# Patient Record
Sex: Male | Born: 1975 | Race: White | Hispanic: No | Marital: Single | Smoking: Former smoker
Health system: Southern US, Community
[De-identification: ages and names within clinical notes are randomized; demographics above are authoritative.]

## PROBLEM LIST (undated history)

## (undated) DIAGNOSIS — C801 Malignant (primary) neoplasm, unspecified: Secondary | ICD-10-CM

## (undated) HISTORY — PX: TESTICLE REMOVAL: SHX68

---

## 2003-11-25 ENCOUNTER — Ambulatory Visit (HOSPITAL_COMMUNITY): Admission: RE | Admit: 2003-11-25 | Discharge: 2003-11-25 | Payer: Self-pay | Admitting: Urology

## 2003-11-28 ENCOUNTER — Observation Stay (HOSPITAL_COMMUNITY): Admission: RE | Admit: 2003-11-28 | Discharge: 2003-11-29 | Payer: Self-pay | Admitting: Urology

## 2003-12-09 ENCOUNTER — Ambulatory Visit: Admission: RE | Admit: 2003-12-09 | Discharge: 2004-02-06 | Payer: Self-pay | Admitting: Radiation Oncology

## 2014-08-23 ENCOUNTER — Other Ambulatory Visit: Payer: Self-pay | Admitting: Family

## 2014-08-23 ENCOUNTER — Ambulatory Visit
Admission: RE | Admit: 2014-08-23 | Discharge: 2014-08-23 | Disposition: A | Discharge status: Home / Self Care | Payer: Medicaid Other | Source: Ambulatory Visit | Attending: Family | Admitting: Family

## 2014-08-23 DIAGNOSIS — M545 Low back pain, unspecified: Secondary | ICD-10-CM

## 2016-01-01 IMAGING — CR DG THORACIC SPINE 3V
3 series · 3 of 3 positions shown · non-contrast
Comparison: None.

CLINICAL DATA: Back pain

EXAM:
THORACIC SPINE - 2 VIEW + SWIMMERS

[view not recorded (1 of 3)]
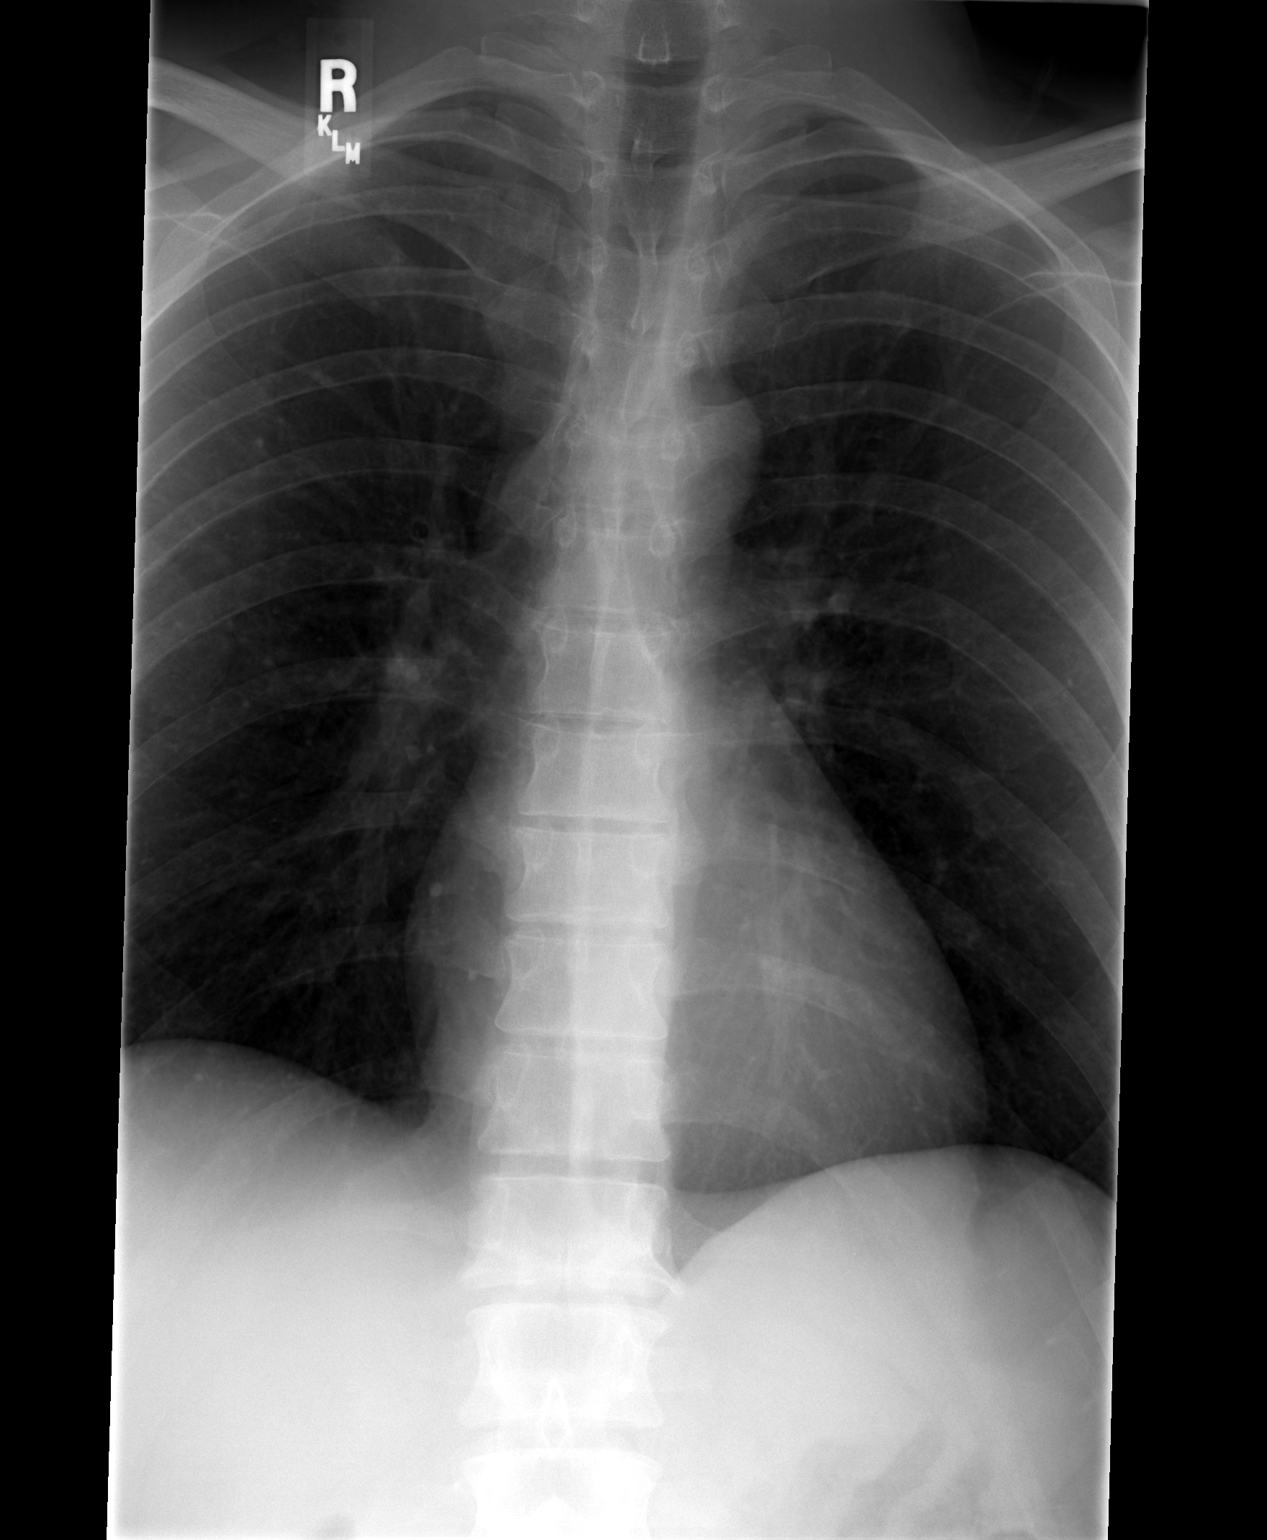

[view not recorded (2 of 3)]
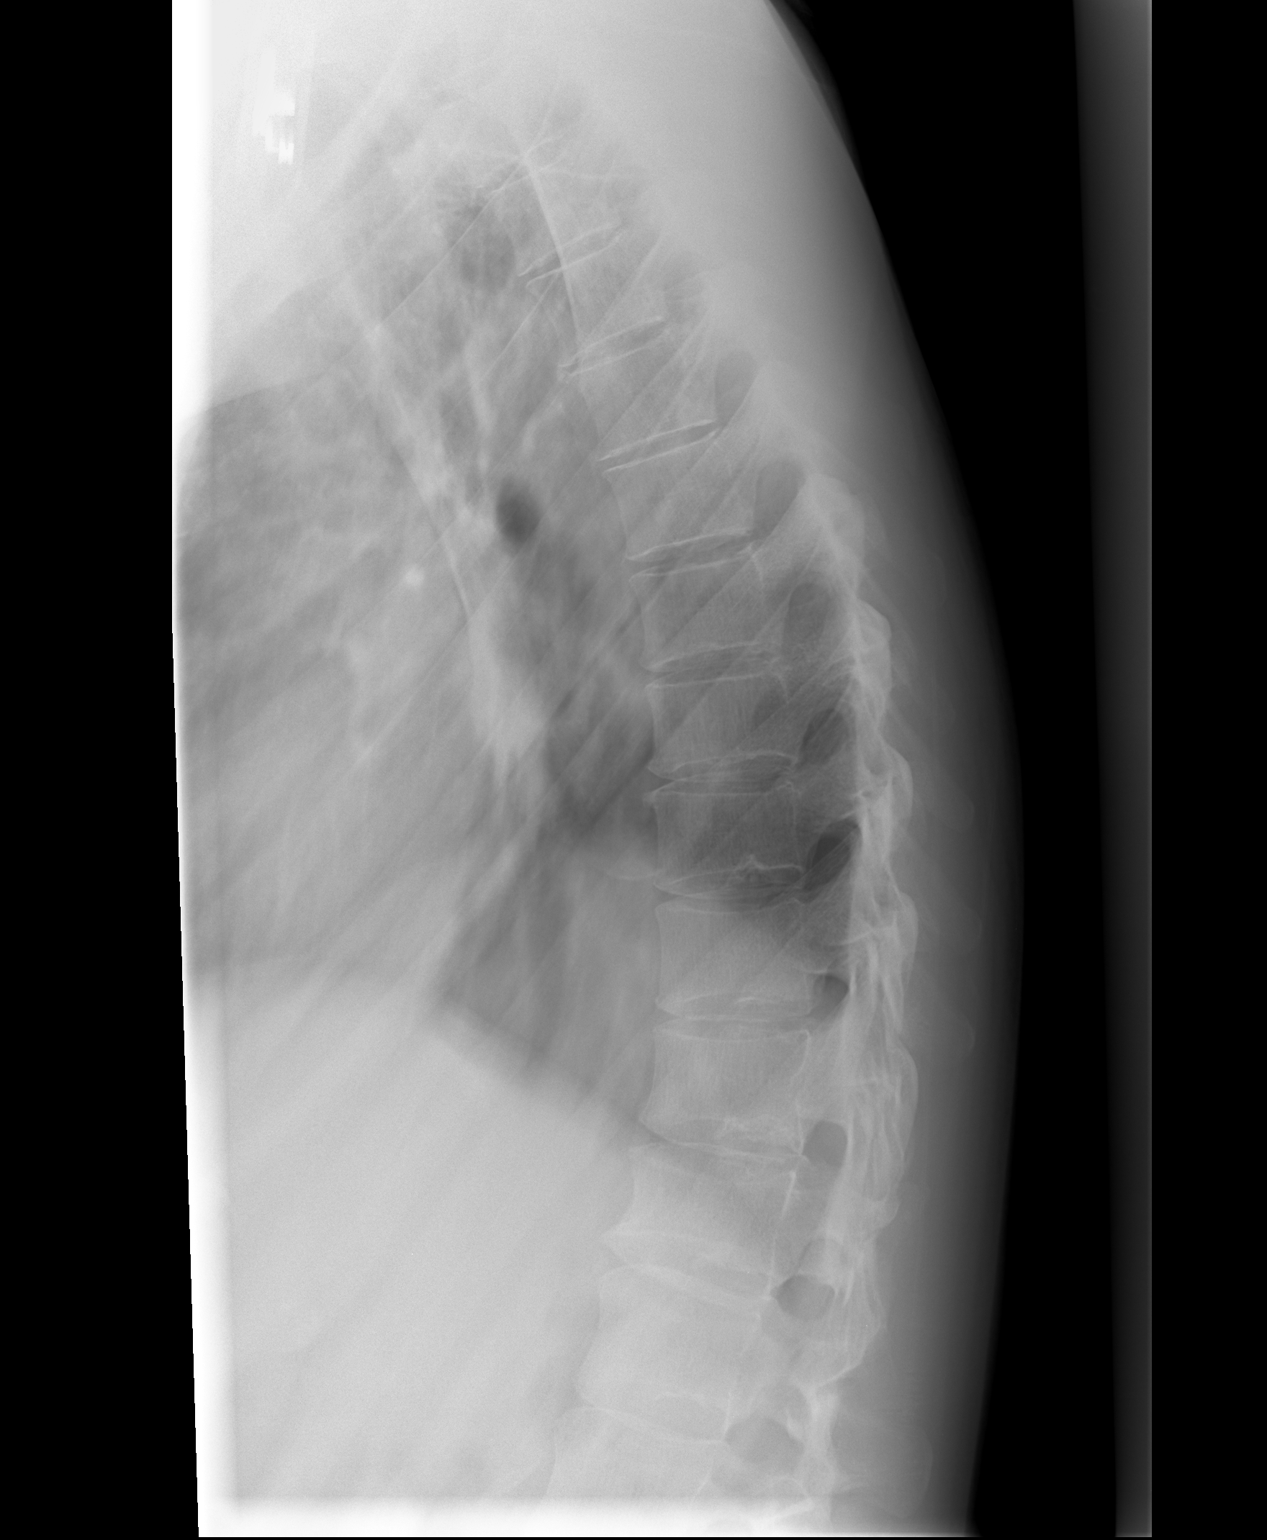

[view not recorded (3 of 3)]
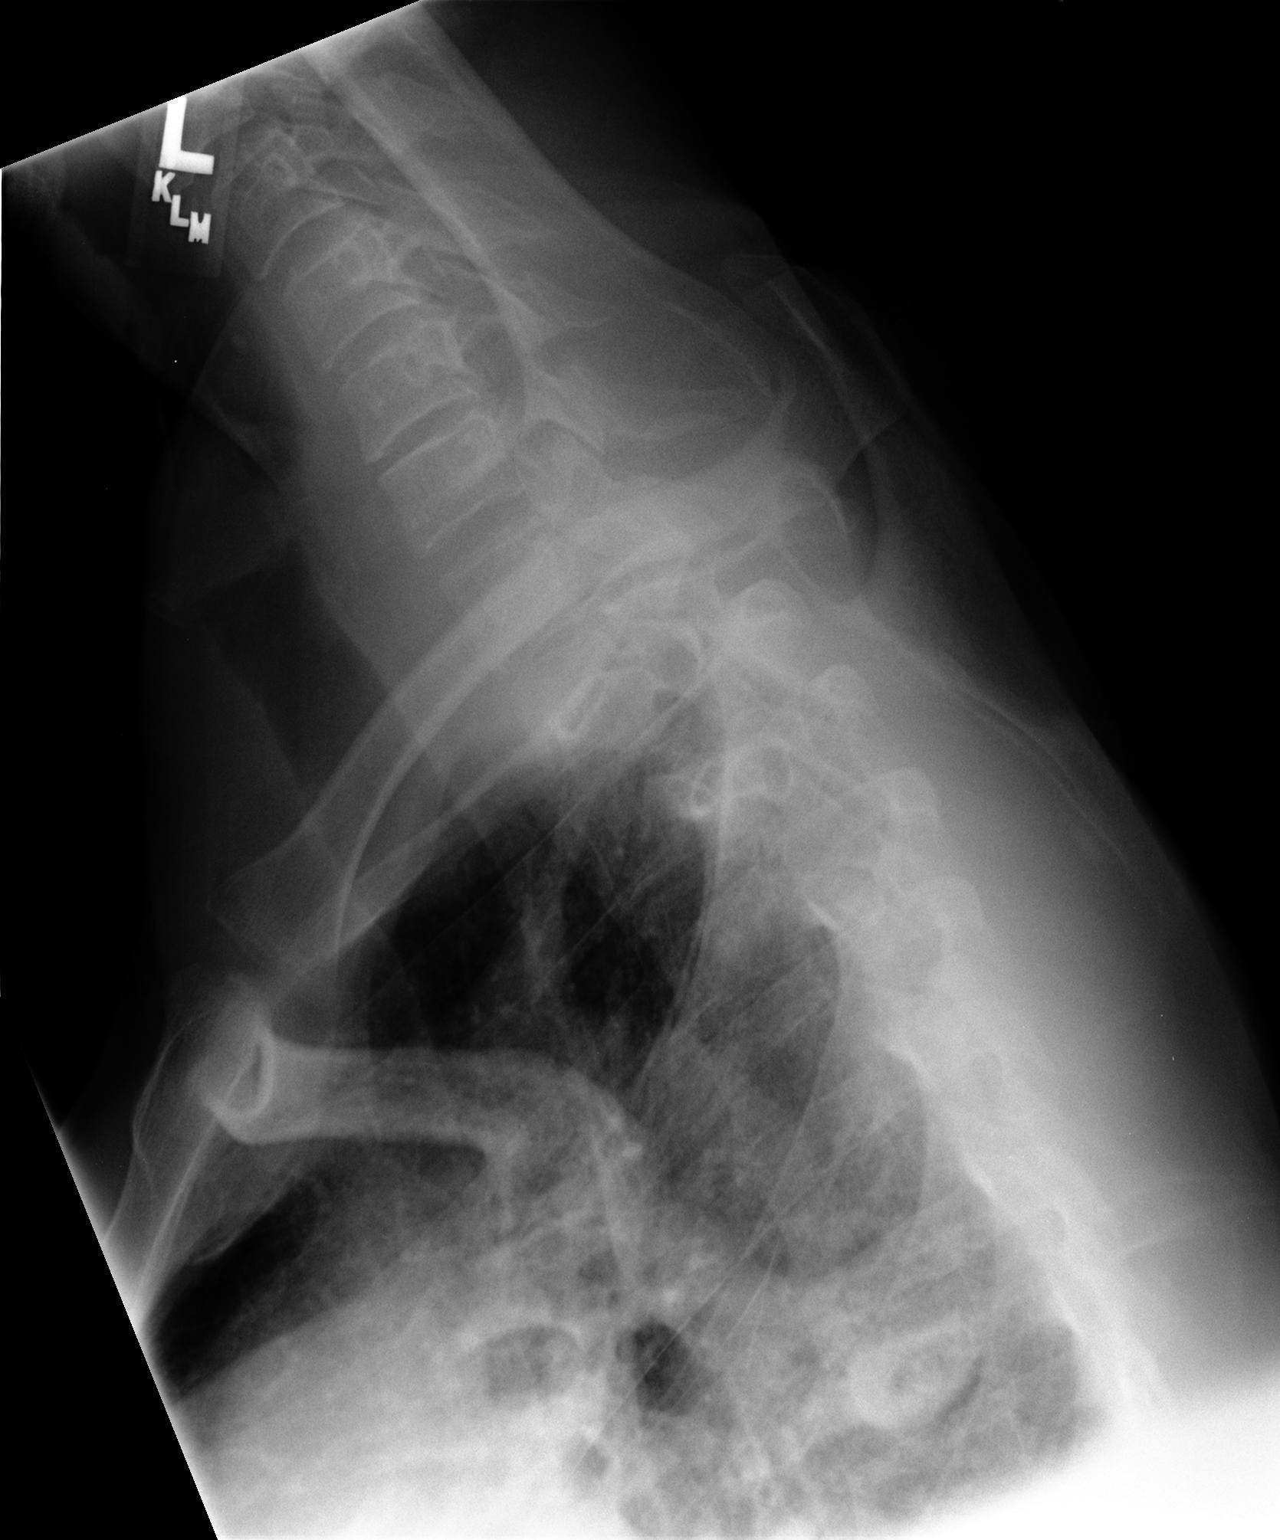

[3 of 3 positions shown; findings below may reference images not displayed]

FINDINGS: Three views of thoracic spine submitted. No acute fracture or
subluxation. Minimal degenerative changes with anterior spurring
lower thoracic spine.
IMPRESSION: No acute fracture or subluxation. Minimal anterior spurring lower
thoracic spine.

## 2017-05-02 ENCOUNTER — Ambulatory Visit (INDEPENDENT_AMBULATORY_CARE_PROVIDER_SITE_OTHER): Payer: Self-pay

## 2017-05-02 ENCOUNTER — Other Ambulatory Visit: Payer: Self-pay | Admitting: Emergency Medicine

## 2017-05-02 DIAGNOSIS — M25511 Pain in right shoulder: Secondary | ICD-10-CM

## 2017-05-02 DIAGNOSIS — R52 Pain, unspecified: Secondary | ICD-10-CM

## 2018-09-10 IMAGING — DX DG SHOULDER 2+V*R*
3 series · 3 of 3 positions shown · non-contrast
Comparison: None.

CLINICAL DATA: Pain after heavy lifting

EXAM:
RIGHT SHOULDER - 2+ VIEW

[shoulder grashey]
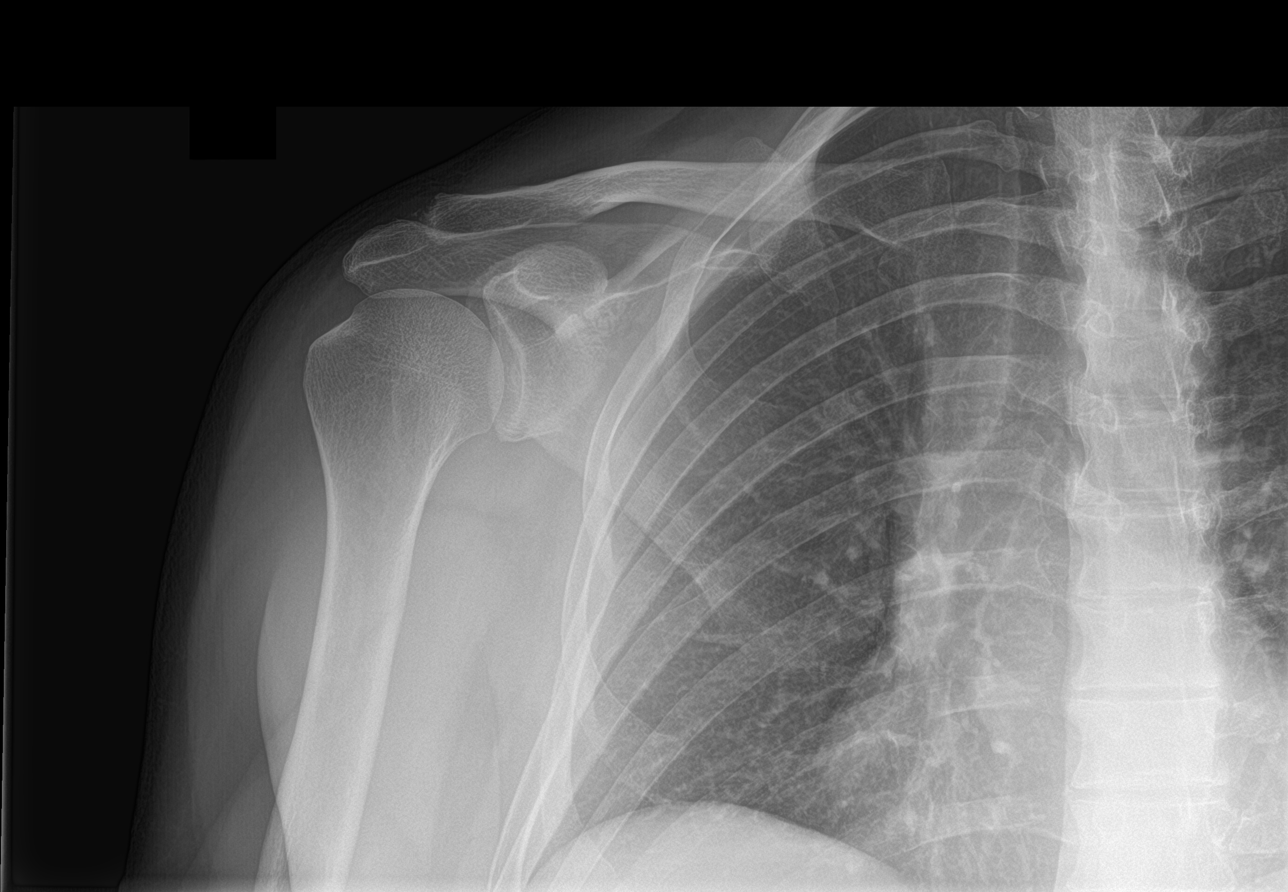

[shoulder y view]
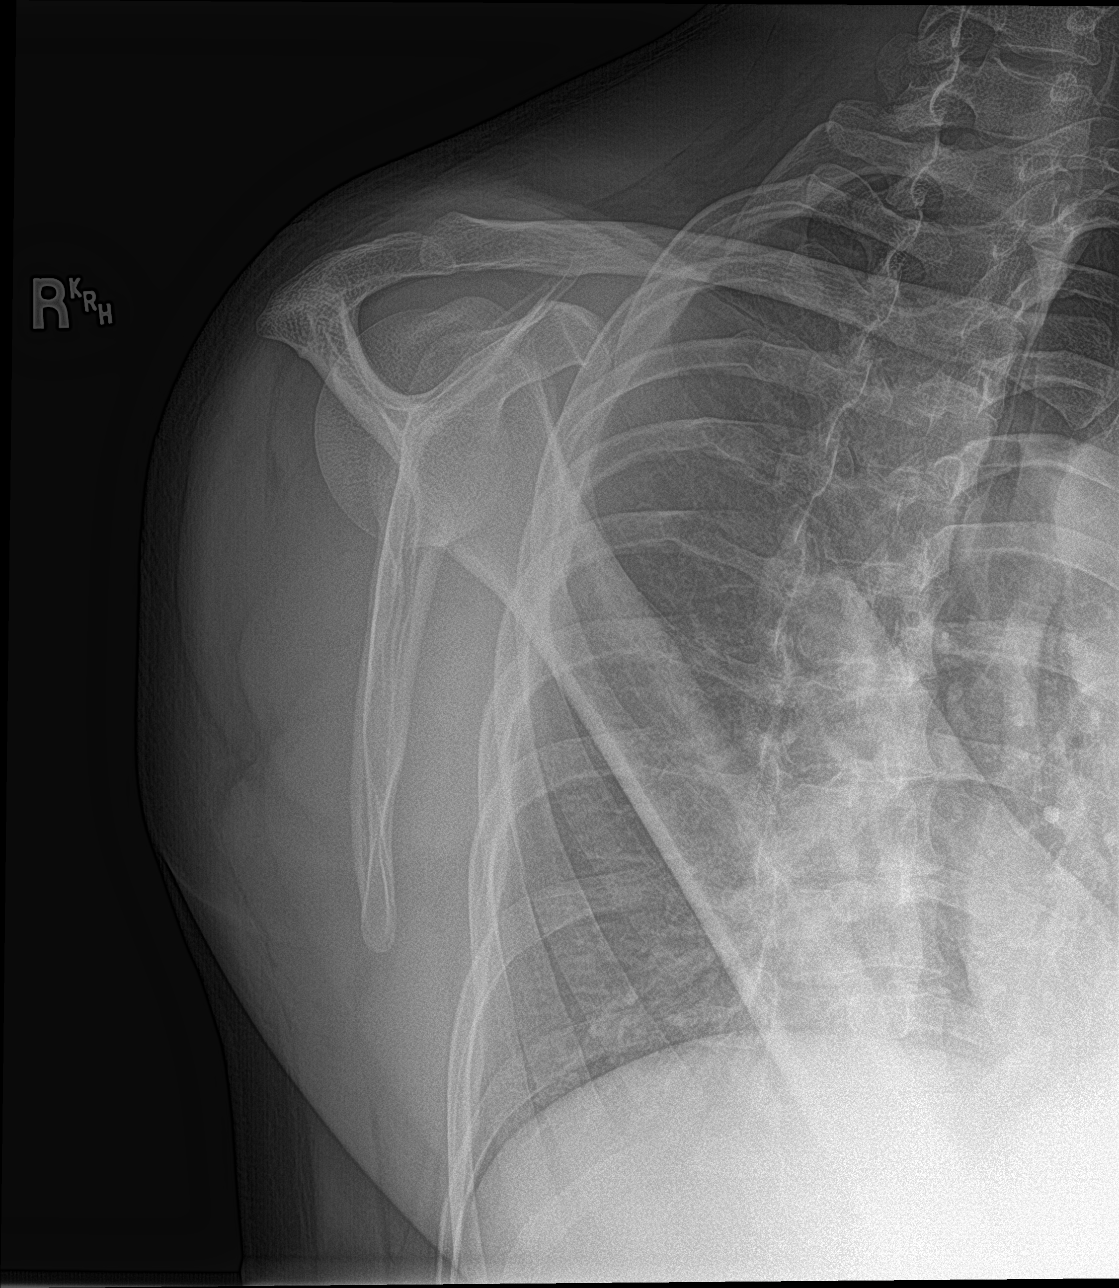

[shoulder axillary]
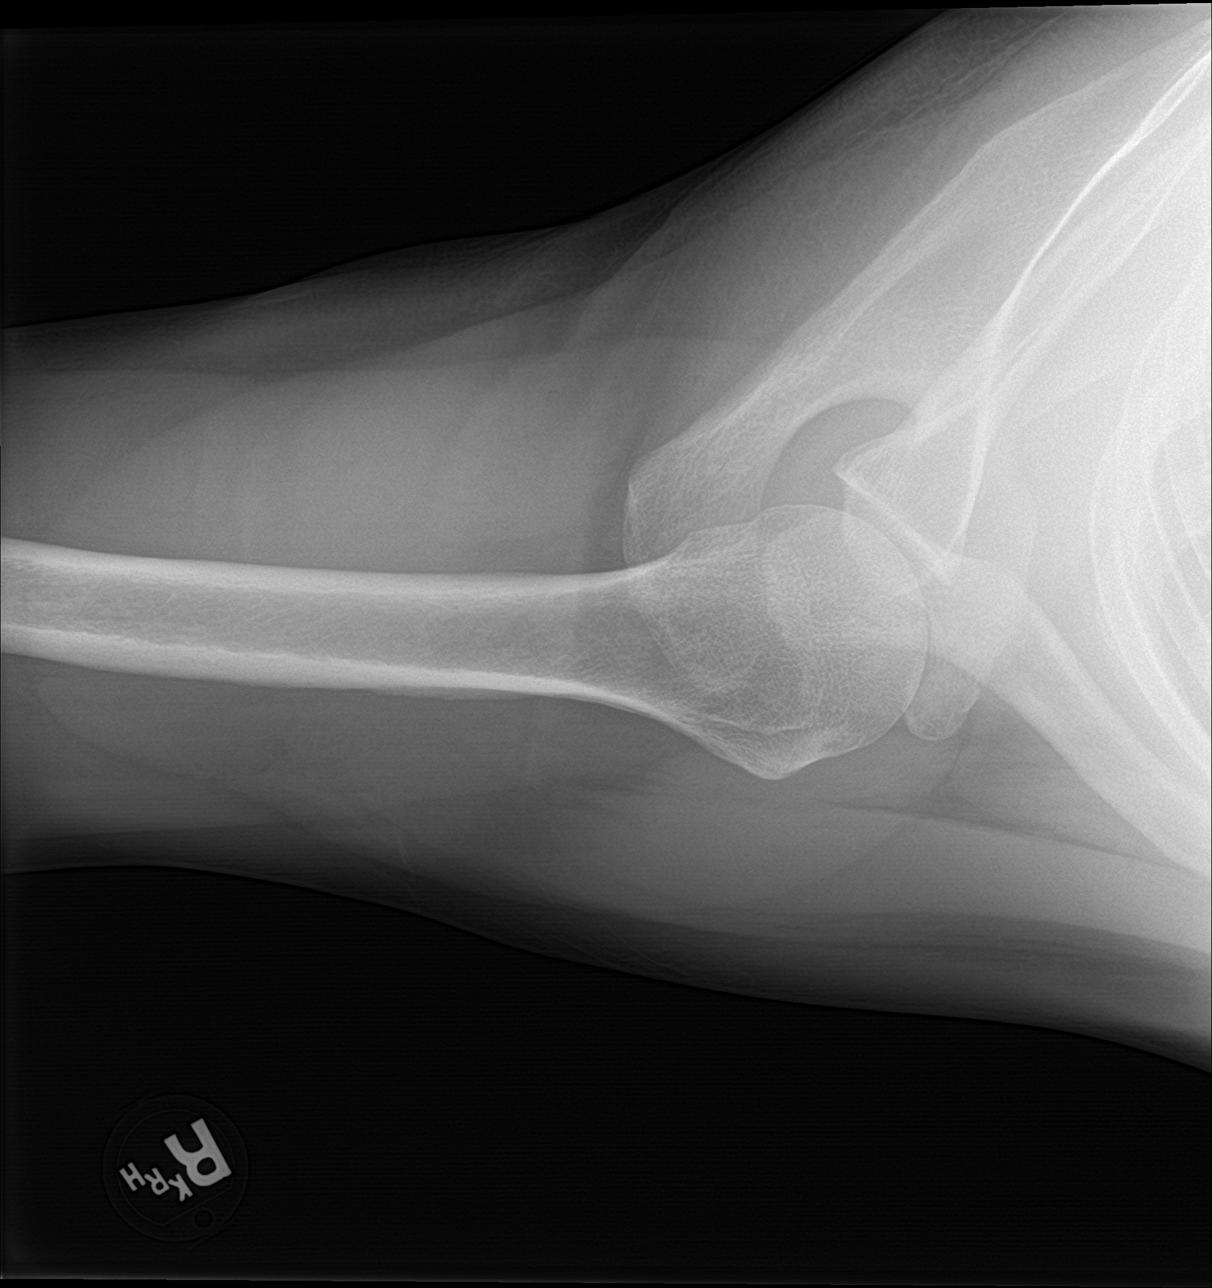

[3 of 3 positions shown; findings below may reference images not displayed]

FINDINGS: Frontal, Y scapular, and axillary images were obtained. No fracture
or dislocation. No appreciable joint space narrowing. Subtle erosion
noted in the lateral right clavicle. Visualized right lung clear
peer
IMPRESSION: Erosion noted in lateral right clavicle. No joint space narrowing.
No fracture or dislocation.

## 2024-05-17 ENCOUNTER — Ambulatory Visit (INDEPENDENT_AMBULATORY_CARE_PROVIDER_SITE_OTHER): Payer: Self-pay

## 2024-05-17 ENCOUNTER — Other Ambulatory Visit: Payer: Self-pay

## 2024-05-17 ENCOUNTER — Ambulatory Visit
Admission: RE | Admit: 2024-05-17 | Discharge: 2024-05-17 | Disposition: A | Discharge status: Home / Self Care | Payer: Self-pay | Source: Ambulatory Visit | Attending: Emergency Medicine | Admitting: Emergency Medicine

## 2024-05-17 VITALS — BP 129/94 | HR 76 | Temp 97.9°F | Resp 16

## 2024-05-17 DIAGNOSIS — S6992XA Unspecified injury of left wrist, hand and finger(s), initial encounter: Secondary | ICD-10-CM

## 2024-05-17 HISTORY — DX: Malignant (primary) neoplasm, unspecified: C80.1

## 2024-05-17 NOTE — Discharge Instructions (Signed)
 Rest - try to avoid heavy lifting Ice - apply for 20 minutes a few times daily Compression - use buddy taping or coban wrap for support Elevation - prop up on a pillow  Ibuprofen/advil every 6 hours for pain  Please follow up with [place] [facility] if needed

## 2024-05-17 NOTE — ED Triage Notes (Signed)
 Pt states he fell walking up the stairs and fell with all his weight and used his hands to catch himself and the left middle finger bent sideways. Pt's left middle finger is deformed. Pt has 2+ swelling in knuckle area.

## 2024-05-17 NOTE — ED Provider Notes (Signed)
 [facility]    CSN: [id number] Arrival date & time: 05/17/24  1728      History   Chief Complaint Chief Complaint  Patient presents with   Finger Injury    Tripped on stairs, finger slightly crooked - Entered by patient    HPI Christopher Kirby is a 48 y.o. male.  Walking up the stairs yesterday when he tripped, caught himself with his LEFT hand.  He noticed the left hand middle finger looks slightly crooked.  He is having pain in the middle and ring fingers. Iced and took advil this morning   Patient is right hand dominant   Past Medical History:  Diagnosis Date   Cancer (HCC)     There are no active problems to display for this patient.   Past Surgical History:  Procedure Laterality Date   TESTICLE REMOVAL Left        Home Medications    Prior to Admission medications   Not on File    Family History History reviewed. No pertinent family history.  Social History Social History   Tobacco Use   Smoking status: Former    Types: Cigarettes   Smokeless tobacco: Never  Substance Use Topics   Alcohol use: Yes    Comment: occ   Drug use: Never     Allergies   Penicillins   Review of Systems Review of Systems Per HPI  Physical Exam Triage Vital Signs ED Triage Vitals  Encounter Vitals Group     BP 05/17/24 1806 (!) 129/94     Systolic BP Percentile --      Diastolic BP Percentile --      Pulse Rate 05/17/24 1806 76     Resp 05/17/24 1806 16     Temp 05/17/24 1806 97.9 F (36.6 C)     Temp Source 05/17/24 1806 Oral     SpO2 05/17/24 1806 94 %     Weight --      Height --      Head Circumference --      Peak Flow --      Pain Score 05/17/24 1804 6     Pain Loc --      Pain Education --      Exclude from Growth Chart --    No data found.  Updated Vital Signs BP (!) 129/94   Pulse 76   Temp 97.9 F (36.6 C) (Oral)   Resp 16   SpO2 94%   Physical Exam Vitals and nursing note reviewed.  Constitutional:       General: He is not in acute distress. HENT:     Mouth/Throat:     Pharynx: Oropharynx is clear.  Cardiovascular:     Rate and Rhythm: Normal rate and regular rhythm.     Pulses: Normal pulses.     Heart sounds: Normal heart sounds.  Pulmonary:     Effort: Pulmonary effort is normal.     Breath sounds: Normal breath sounds.  Musculoskeletal:     Left hand: Swelling and bony tenderness present. Decreased range of motion. Decreased strength. Normal sensation. Normal capillary refill. Normal pulse.       Hands:     Comments: Mild swelling at left hand middle finger MCP. Bony tenderness middle and ring MCP. Limited ROM due to pain, decreased strength due to limited ROM. Distal sensation intact. Cap refill < 2 seconds. There is no obvious deformity unless patient holds hand horizontally (bent 90 degrees at elbow),  the middle finger seems to dip towards ring finger  Skin:    General: Skin is warm and dry.     Capillary Refill: Capillary refill takes less than 2 seconds.  Neurological:     Mental Status: He is alert and oriented to person, place, and time.     UC Treatments / Results  Labs (all labs ordered are listed, but only abnormal results are displayed) Labs Reviewed - No data to display  EKG   Radiology DG Finger Middle Left Result Date: 05/17/2024 CLINICAL DATA:  Status post fall with deformity. EXAM: LEFT MIDDLE FINGER 2+V COMPARISON:  None Available. FINDINGS: There is no evidence of fracture or dislocation. The alignment and joint spaces are normal. There is no evidence of arthropathy or other focal bone abnormality. Soft tissues are unremarkable. IMPRESSION: No fracture or dislocation of the left middle finger. Electronically Signed   By: Chadwick Colonel M.D.   On: 05/17/2024 18:36    Procedures Procedures (including critical care time)  Medications Ordered in UC Medications - No data to display  Initial Impression / Assessment and Plan / UC Course  I have reviewed  the triage vital signs and the nursing notes.  Pertinent labs & imaging results that were available during my care of the patient were reviewed by me and considered in my medical decision making (see chart for details).  Xray of left hand without fracture or dislocation. Images independently reviewed by me, agree with radiology interpretation. Discussed with patient, likely sprain or tendon injury. Buddy wrapped in clinic for support. RICE therapy and pain control. Close follow with sports med if symptoms persist. Patient agrees to plan, no questions  Final Clinical Impressions(s) / UC Diagnoses   Final diagnoses:  Hand injury, left, initial encounter     Discharge Instructions      Rest - try to avoid heavy lifting Ice - apply for 20 minutes a few times daily Compression - use buddy taping or coban wrap for support Elevation - prop up on a pillow  Ibuprofen/advil every 6 hours for pain  Please follow up with [place] [facility] if needed   ED Prescriptions   None    PDMP not reviewed this encounter.   Marwin Primmer, Beth Brooke 05/17/24 4098

## 2024-05-29 ENCOUNTER — Ambulatory Visit (INDEPENDENT_AMBULATORY_CARE_PROVIDER_SITE_OTHER): Payer: Self-pay | Admitting: Family Medicine

## 2024-05-29 ENCOUNTER — Encounter: Payer: Self-pay | Admitting: Family Medicine

## 2024-05-29 ENCOUNTER — Other Ambulatory Visit: Payer: Self-pay

## 2024-05-29 VITALS — BP 116/84 | Ht 68.0 in | Wt 220.0 lb

## 2024-05-29 DIAGNOSIS — S63659A Sprain of metacarpophalangeal joint of unspecified finger, initial encounter: Secondary | ICD-10-CM

## 2024-05-29 DIAGNOSIS — M79645 Pain in left finger(s): Secondary | ICD-10-CM

## 2024-05-29 NOTE — Progress Notes (Signed)
 PCP: Pcp, No  Subjective:  CC: Lt 3rd finger injury HPI: Patient is a 48 y.o. male here for a misaligned finger. RHD  He states that 2 weeks ago he fell while at work and jammed his fingers on a stair case leading to excess ulnar deviation of his left middle finger. Immediately after the fall he had pain along the MCP joints of his 3rd and 4th MCPs as well as ulnar deviation of his 3rd finger. He also endorses swelling along the doral aspect of his hand. He was seen at urgent care that day and x-rays were obtained which were not significant for any bony abnormalities. He was sent home with a buddy taping and his pain has improved since that time, but he continued to have excess ulnar deviation of the 3rd finger. 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 He is able to passively move the digit back into alignment, however it will deviate when force is removed. He has not had any weakness, numbness, or tingling of the digit but does endorse dislocating the finger 8 years ago in a skiing accident. At that time he notes the same finger dislocated laterally at the MCP joint.   Past Medical History:  Diagnosis Date   Cancer (HCC)     No current outpatient medications on file prior to visit.   No current facility-administered medications on file prior to visit.    Past Surgical History:  Procedure Laterality Date   TESTICLE REMOVAL Left     Allergies  Allergen Reactions   Penicillins Dermatitis    BP 116/84   Ht 5\' 8"  (1.727 m)   Wt 220 lb (99.8 kg)   BMI 33.45 kg/m       No data to display              No data to display              Objective:  Physical Exam:  Gen: NAD, comfortable in exam room  Neuro: Sensation: intact to light touch  MSK: Left Hand  Inspection: mild swelling of the 3rd digit, ulnar deviation of 3rd digit over 4th Palpation: no  tenderness to DIP, PIP, MCP, carpal bones, and metacarpals  ROM: normal to flexion and extension at DIP, PIP, MCP Strength: 5/5 to flexion at DIP, PIP, MCP; 4/5 to radial deviation of 3rd finger Special Tests: varus stress of Lt 3rd MCP with increased laxity of radial collateral ligament at MCP without a solid endpoint.  Neg valgus stress at the 3rd MCP.  Neg varus/valgus stress at DIP/PIP of 3rd finger.     Assessment & Plan:  Patient is a 48 y.o. male here for misaligned 3rd finger. Given the mechanism of his injury, previous history of 3rd MCP joint dislocation, and finding of excess joint laxity on physical exam, concerned that his deviation is due to tearing of the radial collateral ligament of the 3rd MCP. Could also be secondary to a carpal bone fracture, however suspicion low given lack of tenderness to palpation and previous xray that was unremarkable. We will proceed with ultrasound of the left hand and refer to hand surgery for further evaluation.   1. Left Radial MCP Collateral Ligament Tear - US  to confirm diagnosis and evaluate for possible late-appearing fracture - Referral to hand surgery for further evaluation  - Buddy taping of 3rd digit  to 2nd digit to prevent deviation towards side of injury - Will follow-up as needed pending workup with hand surgery  Elnoria Hails, MS4 [facility]

## 2024-06-19 ENCOUNTER — Ambulatory Visit (INDEPENDENT_AMBULATORY_CARE_PROVIDER_SITE_OTHER): Payer: Self-pay | Admitting: Orthopedic Surgery

## 2024-06-19 DIAGNOSIS — S6992XA Unspecified injury of left wrist, hand and finger(s), initial encounter: Secondary | ICD-10-CM

## 2024-06-19 NOTE — Progress Notes (Signed)
 Christopher Kirby - 48 y.o. male MRN [id number]  Date of birth: 1976-04-26  Office Visit Note: Visit Date: 06/19/2024 PCP: Pcp, No Referred by: Teressa Rainell BROCKS, DO  Subjective: No chief complaint on file.  HPI: Christopher Kirby is a pleasant 48 y.o. male who presents today for evaluation of a left long finger injury to the MCP region approximately 1 month ago.  He does have a history of multiple injuries to the left long finger MCP with a prior dislocation years ago, also stated that he underwent extensive laceration repair approximate 20 years prior to the dorsal aspect of the hand that did involve the long finger MCP region.  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  Has trialed some buddy taping of the index long and ring finger together with mild relief.  Has also been utilizing anti-inflammatory medication as needed.  Pertinent ROS were reviewed with the patient and found to be negative unless otherwise specified above in HPI.   Visit Reason: Left long finger, MCP radial collateral ligament injury Duration of symptoms:had a fall going up stairs 4 weeks prior Hand dominance: right Occupation: electrician Diabetic: No Smoking: No Heart/Lung History: none Blood Thinners: none  Prior Testing/EMG:xray and u/s  Injections (Date):none Treatments: Buddy taping, anti-inflammatory Prior Surgery:none  Assessment & Plan: Visit Diagnoses:  1. Injury of collateral ligament of finger of left hand, initial encounter     Plan: Based on clinical examination today which correlates with his previous workup, patient does have a left long finger MCP radial collateral ligament injury with notable instability.  The digit is in a deviated position ulnarly at the MCP region and lacks appropriate stability with stress testing.  I reviewed the results of his prior workup including the ultrasound which did show significant  gapping at this region consistent with an RCL injury.  We discussed treatment modalities ranging from conservative to surgical.  From a conservative standpoint, we discussed buddy taping and ongoing anti-inflammatory medication in order to allow the RCL to heal.  I demonstrated for him buddy taping today at the P1 region of the index long finger which he stated did feel much better utilizing Coban wrapping.  I explained that he will need to do this on a regular basis over the next 4 to 6 weeks to allow for further healing and to create additional stability.  I explained that it is quite rare to proceed to surgical intervention for an RCL ligament tear of any digit besides the index finger.  However, patient does work with his hands frequently and has prior injuries to the long finger MCP region with previous dislocations in the past.  I explained that should he have inadequate function of the finger moving forward despite extensive buddy taping of the index to the long finger, we can consider MRI study of the right hand to look for any other structural abnormalities that may explain further dysfunction.  Patient expressed understanding.  He will return to me in approximately 6 weeks for recheck.  Stated he will do the buddy taping as instructed.  I spent 45 minutes in the care of this patient today including review of previous documentation, imaging obtained, face-to-face time discussing all options regarding treatment and documenting the encounter.   Follow-up: No follow-ups on file.   Meds & Orders: No orders of the defined types were placed in this encounter.  No orders of the defined  types were placed in this encounter.    Procedures: No procedures performed      Clinical History: No specialty comments available.  He reports that he has quit smoking. His smoking use included cigarettes. He has never used smokeless tobacco. No results for input(s): HGBA1C, LABURIC in the last 8760  hours.  Objective:   Vital Signs: There were no vitals taken for this visit.  Physical Exam  Gen: Well-appearing, in no acute distress; non-toxic CV: Regular Rate. Well-perfused. Warm.  Resp: Breathing unlabored on room air; no wheezing. Psych: Fluid speech in conversation; appropriate affect; normal thought process  Ortho Exam Left hand: - Notable swelling over the dorsal aspect of the hand, long finger MCP region, both radial and ulnar - Notable tenderness over the RCL region of the MCP Long finger - Deviated positioning of the long finger in the ulnar direction, no stable endpoint to stress testing - Able to form composite fist, sensation intact distally in all distributions, hand remains warm well-perfused, AIN/PIN/interosseous intact  Imaging: No results found.  Past Medical/Family/Surgical/Social History: Medications & Allergies reviewed per EMR, new medications updated. There are no active problems to display for this patient.  Past Medical History:  Diagnosis Date   Cancer (HCC)    No family history on file. Past Surgical History:  Procedure Laterality Date   TESTICLE REMOVAL Left    Social History   Occupational History   Not on file  Tobacco Use   Smoking status: Former    Types: Cigarettes   Smokeless tobacco: Never  Substance and Sexual Activity   Alcohol use: Yes    Comment: occ   Drug use: Never   Sexual activity: Not on file    Mico Spark Afton Alderton, M.D.  [facility]

## 2024-07-31 ENCOUNTER — Encounter: Payer: Self-pay | Admitting: Rehabilitative and Restorative Service Providers"

## 2024-07-31 ENCOUNTER — Ambulatory Visit (INDEPENDENT_AMBULATORY_CARE_PROVIDER_SITE_OTHER): Payer: Self-pay | Admitting: Rehabilitative and Restorative Service Providers"

## 2024-07-31 ENCOUNTER — Ambulatory Visit (INDEPENDENT_AMBULATORY_CARE_PROVIDER_SITE_OTHER): Payer: Self-pay | Admitting: Orthopedic Surgery

## 2024-07-31 DIAGNOSIS — M79642 Pain in left hand: Secondary | ICD-10-CM

## 2024-07-31 DIAGNOSIS — R600 Localized edema: Secondary | ICD-10-CM

## 2024-07-31 DIAGNOSIS — S6992XA Unspecified injury of left wrist, hand and finger(s), initial encounter: Secondary | ICD-10-CM

## 2024-07-31 DIAGNOSIS — M6281 Muscle weakness (generalized): Secondary | ICD-10-CM

## 2024-07-31 DIAGNOSIS — R278 Other lack of coordination: Secondary | ICD-10-CM

## 2024-07-31 NOTE — Progress Notes (Signed)
 Christopher Kirby - 48 y.o. male MRN [id number]  Date of birth: 06/11/1976  Office Visit Note: Visit Date: 07/31/2024 PCP: Pcp, No Referred by: No ref. provider found  Subjective: No chief complaint on file.  HPI: Christopher Kirby is a pleasant 48 y.o. male who returns today for evaluation of a left long finger injury to the MCP region approximately 8 weeks ago.  He does have a history of multiple injuries to the left long finger MCP with a prior dislocation years ago, also stated that he underwent extensive laceration repair approximate 20 years prior to the dorsal aspect of the hand that did involve the long finger MCP region.  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  Has trialed buddy taping of the index long and ring finger together with mild relief, however he has since had recurrence of his symptoms.  We had discussed relative motion orthosis in the past, however he had elected to continue with buddy taping.  At this juncture, given his persistent symptoms, he is interested in discussing potential surgical intervention.  Pertinent ROS were reviewed with the patient and found to be negative unless otherwise specified above in HPI.   Visit Reason: Left long finger, MCP radial collateral ligament injury Duration of symptoms:had a fall going up stairs 4 weeks prior Hand dominance: right Occupation: electrician Diabetic: No Smoking: No Heart/Lung History: none Blood Thinners: none  Prior Testing/EMG:xray and u/s  Injections (Date):none Treatments: Buddy taping, anti-inflammatory Prior Surgery:none  Assessment & Plan: Visit Diagnoses:  1. Injury of collateral ligament of finger of left hand, initial encounter     Plan: Based on clinical examination today which correlates with his previous workup, patient does have a left long finger MCP radial collateral ligament injury with notable instability.  The  digit is in a deviated position ulnarly at the MCP region and lacks appropriate stability with stress testing.  I did once again discuss the results of his prior workup including the ultrasound which did show significant gapping at this region consistent with an RCL injury.  We discussed treatment modalities ranging from conservative to surgical.  From a conservative standpoint, we discussed buddy taping versus relative motion orthosis and ongoing anti-inflammatory medication in order to allow the RCL to heal.  From a surgical standpoint, we discussed since her tendon stabilization with radial collateral ligament reconstruction as well as all risk and benefits.  He did express some interest in surgical intervention given his persistent symptoms, however he did acknowledge that he did not trial relative motion orthosis yet.  He does have some important jobs coming up in the near future and will be unable to undergo surgery given the downtime.  For this reason, he would like to trial a relative motion orthosis which I think is appropriate.  I will have him seen by occupational therapy today for fabrication of the RMO.  He can return in approximate 6 weeks time to track his progress and see how he is doing.  Should symptoms remain refractory conservative care, we can discuss potential RCL reconstruction at that time.  I spent 30 minutes in the care of this patient today including review of previous documentation, imaging obtained, face-to-face time discussing all options regarding treatment and documenting the encounter.    Follow-up: No follow-ups on file.   Meds & Orders: No orders of the defined types were placed in this encounter.   Orders Placed  This Encounter  Procedures   Ambulatory referral to Occupational Therapy     Procedures: No procedures performed      Clinical History: No specialty comments available.  He reports that he has quit smoking. His smoking use included cigarettes. He  has never used smokeless tobacco. No results for input(s): HGBA1C, LABURIC in the last 8760 hours.  Objective:   Vital Signs: There were no vitals taken for this visit.  Physical Exam  Gen: Well-appearing, in no acute distress; non-toxic CV: Regular Rate. Well-perfused. Warm.  Resp: Breathing unlabored on room air; no wheezing. Psych: Fluid speech in conversation; appropriate affect; normal thought process  Ortho Exam Left hand: - Minimal swelling over the dorsal aspect of the hand, long finger MCP region, both radial and ulnar - Mild tenderness over the RCL region of the MCP Long finger - Deviated positioning of the long finger in the ulnar direction, no stable endpoint to stress testing - Able to form composite fist, sensation intact distally in all distributions, hand remains warm well-perfused, AIN/PIN/interosseous intact  Imaging: No results found.  Past Medical/Family/Surgical/Social History: Medications & Allergies reviewed per EMR, new medications updated. There are no active problems to display for this patient.  Past Medical History:  Diagnosis Date   Cancer (HCC)    No family history on file. Past Surgical History:  Procedure Laterality Date   TESTICLE REMOVAL Left    Social History   Occupational History   Not on file  Tobacco Use   Smoking status: Former    Types: Cigarettes   Smokeless tobacco: Never  Substance and Sexual Activity   Alcohol use: Yes    Comment: occ   Drug use: Never   Sexual activity: Not on file    Christopher Kirby Christopher Kirby, M.D. [facility], [facility]

## 2024-07-31 NOTE — Therapy (Signed)
 OUTPATIENT OCCUPATIONAL THERAPY ORTHO EVALUATION  Patient Name: Christopher Kirby MRN: [id number] DOB:1976-06-13, 48 y.o., male Today's Date: 07/31/2024  PCP: N/A REFERRING PROVIDER: Arlinda Buster, MD   END OF SESSION:  OT End of Session - 07/31/24 1059     Visit Number 1    Number of Visits 6    Date for OT Re-Evaluation 10/12/24    Authorization Type Self-pay    OT Start Time 1059    OT Stop Time 1131    OT Time Calculation (min) 32 min    Equipment Utilized During Treatment orthotic materials    Activity Tolerance Patient tolerated treatment well;No increased pain;Patient limited by fatigue;Patient limited by pain          Past Medical History:  Diagnosis Date   Cancer [facility]    Past Surgical History:  Procedure Laterality Date   TESTICLE REMOVAL Left    There are no active problems to display for this patient.   ONSET DATE: 05/17/24 DOI  REFERRING DIAG: D30.07KJ (ICD-10-CM) - Injury of collateral ligament of finger of left hand, initial encounter   THERAPY DIAG:  Pain in left hand - Plan: Ot plan of care cert/re-cert  Muscle weakness (generalized) - Plan: Ot plan of care cert/re-cert  Localized edema - Plan: Ot plan of care cert/re-cert  Other lack of coordination - Plan: Ot plan of care cert/re-cert  Rationale for Evaluation and Treatment: Rehabilitation  SUBJECTIVE:   SUBJECTIVE STATEMENT: He states he had an injury to the left hand dorsal surface of the MCP joints especially the middle finger over 12 weeks ago after which he did not immobilize it or rest.  He had buddy tapes and Moving, his ligament was still gapping and painful.  He has been working and using his left hand and it has not healed at all, in his words.  He arrives with an order for therapy and relative motion extension orthosis.    PERTINENT HISTORY: He has had past injuries and surgeries to this MCP joint with histories of subluxations  PRECAUTIONS: None  RED  FLAGS: None   WEIGHT BEARING RESTRICTIONS: Yes <5# now in Lt had and likely for 6 more weeks   PAIN:  Are you having pain? Yes: NPRS scale: at worst 10/10 in past week, 0/10 pain at rest now  Pain location: Left hand dorsum of the middle finger MCP joint Pain description: Sharp at times none significant at rest Aggravating factors: Squeezing and gripping objects Relieving factors: Unsure  FALLS: Has patient fallen in last 6 months? Yes. Number of falls 1 not considered a fall risk  PLOF: Independent  PATIENT GOALS: To improve pain and ability with left hand  NEXT MD VISIT: As needed   OBJECTIVE: (All objective assessments below are from initial evaluation on: 07/31/24 unless otherwise specified.)   HAND DOMINANCE: Right   ADLs: Overall ADLs: States decreased ability to grab, hold household objects, pain and difficulty to open containers, perform FMS tasks    FUNCTIONAL OUTCOME MEASURES: Eval: Patient Specific Functional Scale: 3 (grabbing, holding, lifting, billiards, guitar)  (Higher Score  =  Better Ability for the Selected Tasks)      UPPER EXTREMITY ROM      Hand AROM Left eval  Full Fist Ability (or Gap to Distal Palmar Crease) Loose and tender  Thumb Opposition  (Kapandji Scale)  WFL  Long MCP (0-90) 0- 82   Long PIP (0-100)  0- 99  Long DIP (0-70)  0- 70  (Blank rows =  not tested)   HAND FUNCTION: Eval: Observed weakness in affected Lt hand. Details TBD Grip strength Right: 113.6 lbs, Left: NT lbs   COORDINATION: Eval: Observed coordination impairments with affected Lt hand as seen by pain with motion and self reports.  Details TBD as needed   SENSATION: Eval:  Light touch intact today  EDEMA:   Eval:  Mildly swollen in left hand dorsally over the MCP joint on the radial side  COGNITION: Eval: Overall cognitive status: WFL for evaluation today   OBSERVATIONS:   Eval: Overt swelling, instability of the extensor hood with full flexion.  Pencil test  shows good stability, so relative motion extension orthosis is indicated-however some protocols to call for immobilization for 6 weeks.  Lt MF RCL injury   TODAY'S TREATMENT:  Post-evaluation treatment:   For safety/self-care he was recommended to do no significant weightbearing through the left hand now and over the next 6 to 8 weeks.  He should not do anything that causes pain especially sharp pain as this is likely reinjuring himself.  He states understanding.  Custom orthotic fabrication was indicated due to pt's torn RCL/extensor hood injury of the left hand middle finger and need for safe, functional positioning. OT fabricated custom relative motion extension orthosis for pt today to keep middle finger extended and slightly deviated radially. It fit well with no areas of pressure, pt states a comfortable fit. Pt was educated on the wearing schedule (on at all times except for hygiene), to avoid exposing it to sources of heat, to wipe clean as needed (do not wash, use harsh detergents), to call or come in ASAP if it is causing any irritation or is not achieving desired function. It will be checked/adjusted in upcoming sessions, as needed. Pt states understanding all directions.    He was recommended to follow-up as soon as possible to ensure that this is keeping his hand safe, without pain, and to explore the need for immobilization.  After 6 to 8 weeks of healing, he may also benefit from therapy in the future to work on any stiffness or weakness that has emerged or resulted.   PATIENT EDUCATION: Education details: See tx section above for details  Person educated: Patient Education method: Verbal Instruction, Teach back, Handouts  Education comprehension: States and demonstrates understanding, Additional Education required    HOME EXERCISE PROGRAM: See tx section above for details    GOALS: Goals reviewed with patient? Yes   SHORT TERM GOALS: (STG required if POC>30 days) Target  Date: 07/31/2024  Pt will obtain protective, custom orthotic. Goal status:  MET    LONG TERM GOALS: Target Date: 10/12/2024  Pt will improve functional ability by decreased impairment per PSFS assessment from 3 to 6.5 or better, for better quality of life. Goal status: INITIAL  2.  Pt will improve grip strength in left hand to at least 65 lbs for functional use at home and in IADLs. Goal status: INITIAL  3.  Pt will improve strength in left middle finger extension and flexion to at least 4+/5 MMT to have increased functional ability to carry out selfcare and higher-level homecare tasks with less difficulty. Goal status: INITIAL  4.  Pt will improve coordination skills in left hand and arm, as seen by within functional limit score on nine-hole peg testing to have increased functional ability to carry out fine motor tasks (fasteners, etc.) and more complex, coordinated IADLs (meal prep, sports, etc.).  Goal status: INITIAL  5.  Pt will  decrease pain at worst from 10/10 to 2/10 or better to have better sleep and occupational participation in daily roles. Goal status: INITIAL   ASSESSMENT:  CLINICAL IMPRESSION: Patient is a 47y.o. male who was seen today for occupational therapy evaluation for left hand middle finger radial collateral ligament injury with instability, swelling, pain, decreased functional ability.  The patient will benefit from outpatient occupational therapy to decrease symptoms, improve functional upper extremity use, and increase quality of life.  He may require an initial 6 to 8 weeks of rest/orthosis/immobilization, benefit from therapy afterwards to help with stiffness and weakness.  PERFORMANCE DEFICITS: in functional skills including ADLs, IADLs, coordination, dexterity, ROM, strength, pain, fascial restrictions, flexibility, Fine motor control, body mechanics, endurance, decreased knowledge of precautions, and UE functional use, cognitive skills including problem  solving and safety awareness, and psychosocial skills including coping strategies, environmental adaptation, habits, and routines and behaviors.   IMPAIRMENTS: are limiting patient from ADLs, IADLs, rest and sleep, and leisure.   COMORBIDITIES: may have co-morbidities  that affects occupational performance. Patient will benefit from skilled OT to address above impairments and improve overall function.  MODIFICATION OR ASSISTANCE TO COMPLETE EVALUATION: No modification of tasks or assist necessary to complete an evaluation.  OT OCCUPATIONAL PROFILE AND HISTORY: Problem focused assessment: Including review of records relating to presenting problem.  CLINICAL DECISION MAKING: LOW - limited treatment options, no task modification necessary  REHAB POTENTIAL: Good  EVALUATION COMPLEXITY: Low      PLAN:  OT FREQUENCY: 1-2x/week  OT DURATION: 10 weeks through 10/12/2024 and up to 6 total visits as needed   PLANNED INTERVENTIONS: 97535 self care/ADL training, 02889 therapeutic exercise, 97530 therapeutic activity, 97112 neuromuscular re-education, 97140 manual therapy, 97035 ultrasound, 97032 electrical stimulation (manual), 97760 Orthotic Initial, [id number] Orthotic/Prosthetic subsequent, compression bandaging, Dry needling, energy conservation, coping strategies training, and patient/family education  RECOMMENDED OTHER SERVICES: none now    CONSULTED AND AGREED WITH PLAN OF CARE: Patient  PLAN FOR NEXT SESSION:   Review initial HEP and recommendations check orthosis and consider immobilization if needed   Melvenia Ada, OTR/L, CHT  07/31/2024, 11:47 AM

## 2024-08-20 NOTE — Therapy (Signed)
 OUTPATIENT OCCUPATIONAL THERAPY TREATMENT NOTE  Patient Name: Christopher Kirby MRN: [id number] DOB:October 18, 1976, 48 y.o., male Today's Date: 08/21/2024  PCP: N/A REFERRING PROVIDER: Arlinda Buster, MD   END OF SESSION:  OT End of Session - 08/21/24 9157     Visit Number 2    Number of Visits 6    Date for OT Re-Evaluation 10/12/24    Authorization Type Self-pay    OT Start Time 0843    OT Stop Time 0940    OT Time Calculation (min) 57 min    Equipment Utilized During Treatment orthotic materials    Activity Tolerance Patient tolerated treatment well;No increased pain;Patient limited by fatigue;Patient limited by pain    Behavior During Therapy [facility] for tasks assessed/performed           Past Medical History:  Diagnosis Date   Cancer [facility]    Past Surgical History:  Procedure Laterality Date   TESTICLE REMOVAL Left    There are no active problems to display for this patient.   ONSET DATE: 05/17/24 DOI  REFERRING DIAG: D30.07KJ (ICD-10-CM) - Injury of collateral ligament of finger of left hand, initial encounter   THERAPY DIAG:  Pain in left hand  Muscle weakness (generalized)  Localized edema  Other lack of coordination  Rationale for Evaluation and Treatment: Rehabilitation  SUBJECTIVE:   SUBJECTIVE STATEMENT: He states he knows that the orthosis is helping him, because he did take it off for 1 week during which he had high pain, swelling, more increased problems.  He states he was busy at work and had to remove the brace.  Now he states that he has been back to wearing it for another week and pain is reduced and his symptoms are relieved.  He still has some tenderness when making a fist though it is better.      PERTINENT HISTORY: He has had past injuries and surgeries to this MCP joint with histories of subluxations  PRECAUTIONS: None  RED FLAGS: None   WEIGHT BEARING RESTRICTIONS: Yes <5# now in Lt had and likely for 6 more weeks   PAIN:  Are  you having pain? Yes: NPRS scale: at worst 3-4/10 in past week, 0/10 pain at rest now  Pain location: Left hand dorsum of the middle finger MCP joint Pain description: Sharp at times none significant at rest Aggravating factors: Squeezing and gripping objects Relieving factors: Unsure  FALLS: Has patient fallen in last 6 months? Yes. Number of falls 1 not considered a fall risk  PLOF: Independent  PATIENT GOALS: To improve pain and ability with left hand  NEXT MD VISIT: As needed   OBJECTIVE: (All objective assessments below are from initial evaluation on: 07/31/24 unless otherwise specified.)   HAND DOMINANCE: Right   ADLs: Overall ADLs: States decreased ability to grab, hold household objects, pain and difficulty to open containers, perform FMS tasks    FUNCTIONAL OUTCOME MEASURES: Eval: Patient Specific Functional Scale: 3 (grabbing, holding, lifting, billiards, guitar)  (Higher Score  =  Better Ability for the Selected Tasks)      UPPER EXTREMITY ROM      Hand AROM Left eval Lt TBD  Full Fist Ability (or Gap to Distal Palmar Crease) Loose and tender   Thumb Opposition  (Kapandji Scale)  WFL   Long MCP (0-90) 0- 82    Long PIP (0-100)  0- 99   Long DIP (0-70)  0- 70   (Blank rows = not tested)   HAND FUNCTION:  Eval: Observed weakness in affected Lt hand. Details TBD Grip strength Right: 113.6 lbs, Left: NT lbs   COORDINATION: Eval: Observed coordination impairments with affected Lt hand as seen by pain with motion and self reports.  Details TBD as needed  EDEMA:   Eval:  Mildly swollen in left hand dorsally over the MCP joint on the radial side  OBSERVATIONS:   Eval: Overt swelling, instability of the extensor hood with full flexion.  Pencil test shows good stability, so relative motion extension orthosis is indicated-however some protocols to call for immobilization for 6 weeks.  Lt MF RCL injury   TODAY'S TREATMENT:  08/21/24: His hand is moving better  visually, making and near to full fist and fully extending.  This is less tender and painful for him now and his swelling is improved.  We discussed self-care/safety in terms of trying to wear the orthosis every day at all times to prevent strain to the RCL/extensor tendons.  As this relative motion orthosis does rub on his adjacent finger somewhat, OT fabricates a new orthosis today it will immobilize the MP joints on an approximate 50 degree angle of flexion and allow him a chance to rest from the relative motion orthosis and prevent skin breakdown and rubbing, etc.   He states this fits well and feels good, he cannot hurt himself with flexion or extension while wearing the new orthosis.  He can wear this at night to rest from the relative motion orthosis, and he can wear in the day if needed any emergent situations, otherwise he was recommended to stick with the relative motion orthosis in the day.  Additionally, OT reminds him to be doing no significant pushing pulling or weightbearing with that hand, and also starts to describe the progression of therapy after the wound has healed.  In 3 to 6 weeks, he should have little to no pain with opening and closing his hand-though he may have some stiffness.  We discussed starting with active range of motion to tension points and weaning from the orthosis, moving towards stretching a week or so later, and then moving towards light isometric strengthening a week or so later.  He was recommended to continue to come into therapy to be helped with these progressions, though he can withhold any therapy for the next 4 weeks or so to allow additional healing time.  He should do his best to rest and not cause pain during these next 3 or 4 weeks.  He states understanding all directions and leaves in no significant pain.   PATIENT EDUCATION: Education details: See tx section above for details  Person educated: Patient Education method: Verbal Instruction, Teach back,  Handouts  Education comprehension: States and demonstrates understanding, Additional Education required    HOME EXERCISE PROGRAM: See tx section above for details    GOALS: Goals reviewed with patient? Yes   SHORT TERM GOALS: (STG required if POC>30 days) Target Date: 07/31/2024  Pt will obtain protective, custom orthotic. Goal status:  MET    LONG TERM GOALS: Target Date: 10/12/2024  Pt will improve functional ability by decreased impairment per PSFS assessment from 3 to 6.5 or better, for better quality of life. Goal status: INITIAL  2.  Pt will improve grip strength in left hand to at least 65 lbs for functional use at home and in IADLs. Goal status: INITIAL  3.  Pt will improve strength in left middle finger extension and flexion to at least 4+/5 MMT to have increased  functional ability to carry out selfcare and higher-level homecare tasks with less difficulty. Goal status: INITIAL  4.  Pt will improve coordination skills in left hand and arm, as seen by within functional limit score on nine-hole peg testing to have increased functional ability to carry out fine motor tasks (fasteners, etc.) and more complex, coordinated IADLs (meal prep, sports, etc.).  Goal status: INITIAL  5.  Pt will decrease pain at worst from 10/10 to 2/10 or better to have better sleep and occupational participation in daily roles. Goal status: INITIAL   ASSESSMENT:  CLINICAL IMPRESSION: 08/21/24: He now has a secondary orthosis to prevent skin breakdown and irritation caused by by wearing his relative motion orthosis day and night 24/7 which would be irritating over time.  He states understanding all safety precautions and should return in 3 to 4 weeks for check of status and try to upgrade to active range of motion or perhaps stretching protocols.  Eval: Patient is a 48y.o. male who was seen today for occupational therapy evaluation for left hand middle finger radial collateral ligament injury  with instability, swelling, pain, decreased functional ability.  The patient will benefit from outpatient occupational therapy to decrease symptoms, improve functional upper extremity use, and increase quality of life.  He may require an initial 6 to 8 weeks of rest/orthosis/immobilization, benefit from therapy afterwards to help with stiffness and weakness.      PLAN:  OT FREQUENCY: 1-2x/week  OT DURATION: 10 weeks through 10/12/2024 and up to 6 total visits as needed   PLANNED INTERVENTIONS: 97535 self care/ADL training, 02889 therapeutic exercise, 97530 therapeutic activity, 97112 neuromuscular re-education, 97140 manual therapy, 97035 ultrasound, 97032 electrical stimulation (manual), 97760 Orthotic Initial, [id number] Orthotic/Prosthetic subsequent, compression bandaging, Dry needling, energy conservation, coping strategies training, and patient/family education  RECOMMENDED OTHER SERVICES: none now    CONSULTED AND AGREED WITH PLAN OF CARE: Patient  PLAN FOR NEXT SESSION:   Follow-up in 3 to 4 weeks for check of status and try to upgrade to active range of motion or perhaps stretching protocols.  Melvenia Ada, OTR/L, CHT  08/21/2024, 1:20 PM

## 2024-08-21 ENCOUNTER — Encounter: Payer: Self-pay | Admitting: Rehabilitative and Restorative Service Providers"

## 2024-08-21 ENCOUNTER — Ambulatory Visit (INDEPENDENT_AMBULATORY_CARE_PROVIDER_SITE_OTHER): Payer: Self-pay | Admitting: Rehabilitative and Restorative Service Providers"

## 2024-08-21 DIAGNOSIS — R278 Other lack of coordination: Secondary | ICD-10-CM

## 2024-08-21 DIAGNOSIS — M79642 Pain in left hand: Secondary | ICD-10-CM

## 2024-08-21 DIAGNOSIS — M6281 Muscle weakness (generalized): Secondary | ICD-10-CM

## 2024-08-21 DIAGNOSIS — R600 Localized edema: Secondary | ICD-10-CM

## 2024-09-19 NOTE — Therapy (Signed)
 OUTPATIENT OCCUPATIONAL THERAPY TREATMENT NOTE  Patient Name: Christopher Kirby MRN: [id number] DOB:30-Sep-1976, 48 y.o., male Today's Date: 09/21/2024  PCP: N/A REFERRING PROVIDER: Arlinda Buster, MD   END OF SESSION:  OT End of Session - 09/21/24 1059     Visit Number 3    Number of Visits 6    Date for Recertification  10/12/24    Authorization Type Self-pay    OT Start Time 1059    OT Stop Time 1144    OT Time Calculation (min) 45 min    Activity Tolerance Patient tolerated treatment well;No increased pain;Patient limited by fatigue;Patient limited by pain    Behavior During Therapy [facility] for tasks assessed/performed            Past Medical History:  Diagnosis Date   Cancer [facility])    Past Surgical History:  Procedure Laterality Date   TESTICLE REMOVAL Left    There are no active problems to display for this patient.   ONSET DATE: 05/17/24 DOI  REFERRING DIAG: D30.07KJ (ICD-10-CM) - Injury of collateral ligament of finger of left hand, initial encounter   THERAPY DIAG:  Pain in left hand  Muscle weakness (generalized)  Other lack of coordination  Localized edema  Rationale for Evaluation and Treatment: Rehabilitation  SUBJECTIVE:   SUBJECTIVE STATEMENT: He returns after 4 weeks of careful self-management, should have been protected and immobilized for approx 5-6 weeks in total. He states now having some sciatica which is bothersome... his hand is annoying but less painful.      PERTINENT HISTORY: He has had past injuries and surgeries to this MCP joint with histories of subluxations  PRECAUTIONS: None  RED FLAGS: None   WEIGHT BEARING RESTRICTIONS: Yes <5# now in Lt had and likely for 6 more weeks   PAIN:  Are you having pain? Yes: NPRS scale: at worst  0-1/10 in past week, 0/10 pain at rest now  Pain location: Left hand dorsum of the middle finger MCP joint Pain description: Sharp at times none significant at rest Aggravating factors:  Squeezing and gripping objects Relieving factors: Unsure  FALLS: Has patient fallen in last 6 months? Yes. Number of falls 1 not considered a fall risk  PLOF: Independent  PATIENT GOALS: To improve pain and ability with left hand  NEXT MD VISIT: As needed   OBJECTIVE: (All objective assessments below are from initial evaluation on: 07/31/24 unless otherwise specified.)   HAND DOMINANCE: Right   ADLs: Overall ADLs: States decreased ability to grab, hold household objects, pain and difficulty to open containers, perform FMS tasks    FUNCTIONAL OUTCOME MEASURES: Eval: Patient Specific Functional Scale: 3 (grabbing, holding, lifting, billiards, guitar)  (Higher Score  =  Better Ability for the Selected Tasks)      UPPER EXTREMITY ROM      Hand AROM Left eval Lt 09/21/24  Full Fist Ability (or Gap to Distal Palmar Crease) Loose and tender   Thumb Opposition  (Kapandji Scale)  WFL   Long MCP (0-90) 0- 82  0 - 79  Long PIP (0-100)  0- 99 0 - 96  Long DIP (0-70)  0- 70 0 - 70  (Blank rows = not tested)   HAND FUNCTION: 09/21/24:  grip: Lt 87#    Eval: Observed weakness in affected Lt hand. Details TBD Grip strength Right: 113.6 lbs, Left: NT lbs   COORDINATION: Eval: Observed coordination impairments with affected Lt hand as seen by pain with motion and self reports.  Details TBD as needed  EDEMA:   Eval:  Mildly swollen in left hand dorsally over the MCP joint on the radial side  OBSERVATIONS:   Eval: Overt swelling, instability of the extensor hood with full flexion.  Pencil test shows good stability, so relative motion extension orthosis is indicated-however some protocols to call for immobilization for 6 weeks.  Lt MF RCL injury   TODAY'S TREATMENT:  09/21/24: He starts with active range of motion for exercise as well as new measures, also performing grip today in the left hand which he tolerates well with no pain at the 87 pounds.  His range of motion is a bit  tighter from being restricted in orthoses, but not too bad.  It is excellent that he is not really having pain now or in the past week.  Due to that, OT proceeds to give him a home exercise program including stretches as listed below as well as isometric tendon gliding, gripping, finger abduction.  He tolerates each well, though he was recommended that he could withhold strengthening for a week if he becomes tender.  We also discussed safety/self-care and weaning from his orthoses for lighter tasks with the goal to be completely weaned in 3 to 4 weeks.  He states understanding all directions, he states feeling better by doing the exercises, and he would like to return in approximately 2 weeks for check of status and to review his HEP.   Exercises - BACK KNUCKLE STRETCHES   - 4 x daily - 3-5 reps - 15 sec hold - Seated Finger Composite Flexion Stretch  - 4 x daily - 3-5 reps - 15 hold - Tendon Glides  - 4-6 x daily - 3-5 reps - 2-3 seconds hold - Towel Roll Grip with Forearm in Neutral  - 3 x daily - 5 reps - 10 sec hold - Isometric finger abduction times 5 x 10 seconds x 2 to 3 times a day      08/21/24: His hand is moving better visually, making and near to full fist and fully extending.  This is less tender and painful for him now and his swelling is improved.  We discussed self-care/safety in terms of trying to wear the orthosis every day at all times to prevent strain to the RCL/extensor tendons.  As this relative motion orthosis does rub on his adjacent finger somewhat, OT fabricates a new orthosis today it will immobilize the MP joints on an approximate 50 degree angle of flexion and allow him a chance to rest from the relative motion orthosis and prevent skin breakdown and rubbing, etc.   He states this fits well and feels good, he cannot hurt himself with flexion or extension while wearing the new orthosis.  He can wear this at night to rest from the relative motion orthosis, and he can wear  in the day if needed any emergent situations, otherwise he was recommended to stick with the relative motion orthosis in the day.  Additionally, OT reminds him to be doing no significant pushing pulling or weightbearing with that hand, and also starts to describe the progression of therapy after the wound has healed.  In 3 to 6 weeks, he should have little to no pain with opening and closing his hand-though he may have some stiffness.  We discussed starting with active range of motion to tension points and weaning from the orthosis, moving towards stretching a week or so later, and then moving towards light isometric strengthening a week  or so later.  He was recommended to continue to come into therapy to be helped with these progressions, though he can withhold any therapy for the next 4 weeks or so to allow additional healing time.  He should do his best to rest and not cause pain during these next 3 or 4 weeks.  He states understanding all directions and leaves in no significant pain.   PATIENT EDUCATION: Education details: See tx section above for details  Person educated: Patient Education method: Verbal Instruction, Teach back, Handouts  Education comprehension: States and demonstrates understanding, Additional Education required    HOME EXERCISE PROGRAM: Access Code: [email or web address] URL: [email or web address] Date: 09/21/2024 Prepared by: Melvenia Ada    GOALS: Goals reviewed with patient? Yes   SHORT TERM GOALS: (STG required if POC>30 days) Target Date: 07/31/2024  Pt will obtain protective, custom orthotic. Goal status:  MET    LONG TERM GOALS: Target Date: 10/12/2024  Pt will improve functional ability by decreased impairment per PSFS assessment from 3 to 6.5 or better, for better quality of life. Goal status: INITIAL  2.  Pt will improve grip strength in left hand to at least 65 lbs for functional use at home and in IADLs. Goal status: INITIAL  3.  Pt  will improve strength in left middle finger extension and flexion to at least 4+/5 MMT to have increased functional ability to carry out selfcare and higher-level homecare tasks with less difficulty. Goal status: INITIAL  4.  Pt will improve coordination skills in left hand and arm, as seen by within functional limit score on nine-hole peg testing to have increased functional ability to carry out fine motor tasks (fasteners, etc.) and more complex, coordinated IADLs (meal prep, sports, etc.).  Goal status: INITIAL  5.  Pt will decrease pain at worst from 10/10 to 2/10 or better to have better sleep and occupational participation in daily roles. Goal status: INITIAL   ASSESSMENT:  CLINICAL IMPRESSION: 09/21/24: Tolerating light gripping and pinching as well as finger abduction now.  No pain, likely healing up well.  08/21/24: He now has a secondary orthosis to prevent skin breakdown and irritation caused by by wearing his relative motion orthosis day and night 24/7 which would be irritating over time.  He states understanding all safety precautions and should return in 3 to 4 weeks for check of status and try to upgrade to active range of motion or perhaps stretching protocols.  Eval: Patient is a 48y.o. male who was seen today for occupational therapy evaluation for left hand middle finger radial collateral ligament injury with instability, swelling, pain, decreased functional ability.  The patient will benefit from outpatient occupational therapy to decrease symptoms, improve functional upper extremity use, and increase quality of life.  He may require an initial 6 to 8 weeks of rest/orthosis/immobilization, benefit from therapy afterwards to help with stiffness and weakness.      PLAN:  OT FREQUENCY: 1-2x/week  OT DURATION: 10 weeks through 10/12/2024 and up to 6 total visits as needed   PLANNED INTERVENTIONS: 97535 self care/ADL training, 02889 therapeutic exercise, 97530 therapeutic  activity, 97112 neuromuscular re-education, 97140 manual therapy, 97035 ultrasound, 97032 electrical stimulation (manual), 97760 Orthotic Initial, [id number] Orthotic/Prosthetic subsequent, compression bandaging, Dry needling, energy conservation, coping strategies training, and patient/family education  RECOMMENDED OTHER SERVICES: none now    CONSULTED AND AGREED WITH PLAN OF CARE: Patient  PLAN FOR NEXT SESSION:   Perform a progress note in about 2 weeks and discharge if all  goals are being met and he still has no pain and he is stronger.   Melvenia Ada, OTR/L, CHT  09/21/2024, 12:03 PM

## 2024-09-21 ENCOUNTER — Encounter: Payer: Self-pay | Admitting: Rehabilitative and Restorative Service Providers"

## 2024-09-21 ENCOUNTER — Ambulatory Visit (INDEPENDENT_AMBULATORY_CARE_PROVIDER_SITE_OTHER): Payer: Self-pay | Admitting: Rehabilitative and Restorative Service Providers"

## 2024-09-21 DIAGNOSIS — R600 Localized edema: Secondary | ICD-10-CM

## 2024-09-21 DIAGNOSIS — R278 Other lack of coordination: Secondary | ICD-10-CM

## 2024-09-21 DIAGNOSIS — M79642 Pain in left hand: Secondary | ICD-10-CM

## 2024-09-21 DIAGNOSIS — M6281 Muscle weakness (generalized): Secondary | ICD-10-CM

## 2024-10-04 NOTE — Therapy (Incomplete)
 OUTPATIENT OCCUPATIONAL THERAPY TREATMENT & *** NOTE  Patient Name: Christopher Kirby MRN: [id number] DOB:1976/04/05, 48 y.o., male Today's Date: 10/04/2024  PCP: N/A REFERRING PROVIDER: Arlinda Buster, MD             ***             END OF SESSION:      Past Medical History:  Diagnosis Date   Cancer [facility])    Past Surgical History:  Procedure Laterality Date   TESTICLE REMOVAL Left    There are no active problems to display for this patient.   ONSET DATE: 05/17/24 DOI  REFERRING DIAG: D30.07KJ (ICD-10-CM) - Injury of collateral ligament of finger of left hand, initial encounter   THERAPY DIAG:  No diagnosis found.  Rationale for Evaluation and Treatment: Rehabilitation  SUBJECTIVE:   SUBJECTIVE STATEMENT: He returns after 2 additional weeks of careful self-management, should have been protected for approx 8 weeks in total. He states ***   now having some sciatica which is bothersome... his hand is annoying but less painful.      PERTINENT HISTORY: He has had past injuries and surgeries to this MCP joint with histories of subluxations  PRECAUTIONS: None  RED FLAGS: None   WEIGHT BEARING RESTRICTIONS: Yes <5# now in Lt had and likely for 6 more weeks   PAIN:  Are you having pain? Yes: NPRS scale: at worst  0-1/10 in past week, 0/10 pain at rest now  Pain location: Left hand dorsum of the middle finger MCP joint Pain description: Sharp at times none significant at rest Aggravating factors: Squeezing and gripping objects Relieving factors: Unsure  FALLS: Has patient fallen in last 6 months? Yes. Number of falls 1 not considered a fall risk  PLOF: Independent  PATIENT GOALS: To improve pain and ability with left hand  NEXT MD VISIT: As needed   OBJECTIVE: (All objective assessments below are from initial evaluation on: 07/31/24 unless otherwise specified.)   HAND DOMINANCE: Right   ADLs: Overall ADLs: States decreased  ability to grab, hold household objects, pain and difficulty to open containers, perform FMS tasks    FUNCTIONAL OUTCOME MEASURES: 10/08/24: PSFS: ***  Eval: Patient Specific Functional Scale: 3 (grabbing, holding, lifting, billiards, guitar)  (Higher Score  =  Better Ability for the Selected Tasks)      UPPER EXTREMITY ROM      Hand AROM Left eval Lt 09/21/24 Lt 10/08/24  Full Fist Ability (or Gap to Distal Palmar Crease) Loose and tender    Thumb Opposition  (Kapandji Scale)  WFL    Long MCP (0-90) 0- 82  0 - 79 0 - ***  Long PIP (0-100)  0- 99 0 - 96 0 - ***  Long DIP (0-70)  0- 70 0 - 70 0- ***  (Blank rows = not tested)   HAND FUNCTION: 10/08/24: Grip Lt: ***#    09/21/24:  grip: Lt 87#    Eval: Observed weakness in affected Lt hand. Details TBD Grip strength Right: 113.6 lbs, Left: NT lbs   COORDINATION: Eval: Observed coordination impairments with affected Lt hand as seen by pain with motion and self reports.  Details TBD as needed  EDEMA:   Eval:  Mildly swollen in left hand dorsally over the MCP joint on the radial side  OBSERVATIONS:   Eval: Overt swelling, instability of the extensor hood with full flexion.  Pencil test shows good stability, so relative motion extension orthosis is indicated-however some protocols to  call for immobilization for 6 weeks.  Lt MF RCL injury   TODAY'S TREATMENT:  10/08/24: *** Perform a progress note in about 2 weeks and discharge if all goals are being met and he still has no pain and he is stronger.    09/21/24: He starts with active range of motion for exercise as well as new measures, also performing grip today in the left hand which he tolerates well with no pain at the 87 pounds.  His range of motion is a bit tighter from being restricted in orthoses, but not too bad.  It is excellent that he is not really having pain now or in the past week.  Due to that, OT proceeds to give him a home exercise program including  stretches as listed below as well as isometric tendon gliding, gripping, finger abduction.  He tolerates each well, though he was recommended that he could withhold strengthening for a week if he becomes tender.  We also discussed safety/self-care and weaning from his orthoses for lighter tasks with the goal to be completely weaned in 3 to 4 weeks.  He states understanding all directions, he states feeling better by doing the exercises, and he would like to return in approximately 2 weeks for check of status and to review his HEP.  Exercises - BACK KNUCKLE STRETCHES   - 4 x daily - 3-5 reps - 15 sec hold - Seated Finger Composite Flexion Stretch  - 4 x daily - 3-5 reps - 15 hold - Tendon Glides  - 4-6 x daily - 3-5 reps - 2-3 seconds hold - Towel Roll Grip with Forearm in Neutral  - 3 x daily - 5 reps - 10 sec hold - Isometric finger abduction times 5 x 10 seconds x 2 to 3 times a day   PATIENT EDUCATION: Education details: See tx section above for details  Person educated: Patient Education method: Engineer, structural, Teach back, Handouts  Education comprehension: States and demonstrates understanding, Additional Education required    HOME EXERCISE PROGRAM: Access Code: [email or web address] URL: [email or web address] Date: 09/21/2024 Prepared by: Melvenia Ada    GOALS: Goals reviewed with patient? Yes   SHORT TERM GOALS: (STG required if POC>30 days) Target Date: 07/31/2024  Pt will obtain protective, custom orthotic. Goal status:  MET    LONG TERM GOALS: Target Date: 10/12/2024  Pt will improve functional ability by decreased impairment per PSFS assessment from 3 to 6.5 or better, for better quality of life. Goal status:10/08/24: ***  2.  Pt will improve grip strength in left hand to at least 65 lbs for functional use at home and in IADLs. Goal status: 10/08/24: ***  3.  Pt will improve strength in left middle finger extension and flexion to at least 4+/5 MMT to have  increased functional ability to carry out selfcare and higher-level homecare tasks with less difficulty. Goal status: 10/08/24: ***  4.  Pt will improve coordination skills in left hand and arm, as seen by within functional limit score on nine-hole peg testing to have increased functional ability to carry out fine motor tasks (fasteners, etc.) and more complex, coordinated IADLs (meal prep, sports, etc.).  Goal status: 10/08/24: ***  5.  Pt will decrease pain at worst from 10/10 to 2/10 or better to have better sleep and occupational participation in daily roles. Goal status: 10/08/24: ***   ASSESSMENT:  CLINICAL IMPRESSION: 10/08/24: ***  09/21/24: Tolerating light gripping and pinching as well as finger abduction now.  No pain,  likely healing up well.    PLAN:  OT FREQUENCY: 1-2x/week ***  OT DURATION: 10 weeks through 10/12/2024 and up to 6 total visits as needed   PLANNED INTERVENTIONS: 97535 self care/ADL training, 02889 therapeutic exercise, 97530 therapeutic activity, 97112 neuromuscular re-education, 97140 manual therapy, 97035 ultrasound, 97032 electrical stimulation (manual), 97760 Orthotic Initial, [id number] Orthotic/Prosthetic subsequent, compression bandaging, Dry needling, energy conservation, coping strategies training, and patient/family education  RECOMMENDED OTHER SERVICES: none now    CONSULTED AND AGREED WITH PLAN OF CARE: Patient  PLAN FOR NEXT SESSION:   ***  Melvenia Ada, OTR/L, CHT  10/04/2024, 11:21 AM

## 2024-10-08 ENCOUNTER — Encounter: Payer: Self-pay | Admitting: Rehabilitative and Restorative Service Providers"
